# Patient Record
Sex: Female | Born: 1995 | Race: White | Hispanic: No | Marital: Single | Smoking: Current every day smoker
Health system: Southern US, Community
[De-identification: ages and names within clinical notes are randomized; demographics above are authoritative.]

## PROBLEM LIST (undated history)

## (undated) DIAGNOSIS — R011 Cardiac murmur, unspecified: Secondary | ICD-10-CM

## (undated) DIAGNOSIS — D649 Anemia, unspecified: Secondary | ICD-10-CM

## (undated) HISTORY — DX: Cardiac murmur, unspecified: R01.1

## (undated) HISTORY — DX: Anemia, unspecified: D64.9

---
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
## 2014-03-17 ENCOUNTER — Telehealth: Payer: Self-pay | Admitting: Hematology and Oncology

## 2014-03-17 NOTE — Telephone Encounter (Signed)
S/W PATIENT AND GAVE NP APPT FOR 06/01 @ 10:45 W/DR. GORSUCH REFERRING DR. [place] PACKET MAILED.

## 2014-03-23 ENCOUNTER — Telehealth: Payer: Self-pay | Admitting: Hematology and Oncology

## 2014-03-23 ENCOUNTER — Ambulatory Visit (HOSPITAL_BASED_OUTPATIENT_CLINIC_OR_DEPARTMENT_OTHER): Payer: 59

## 2014-03-23 ENCOUNTER — Ambulatory Visit (HOSPITAL_BASED_OUTPATIENT_CLINIC_OR_DEPARTMENT_OTHER): Payer: 59 | Admitting: Hematology and Oncology

## 2014-03-23 ENCOUNTER — Encounter: Payer: Self-pay | Admitting: Hematology and Oncology

## 2014-03-23 VITALS — BP 122/62 | HR 72 | Temp 97.9°F | Resp 18 | Ht 63.75 in | Wt 119.3 lb

## 2014-03-23 DIAGNOSIS — D509 Iron deficiency anemia, unspecified: Secondary | ICD-10-CM

## 2014-03-23 DIAGNOSIS — D473 Essential (hemorrhagic) thrombocythemia: Secondary | ICD-10-CM

## 2014-03-23 DIAGNOSIS — Z720 Tobacco use: Secondary | ICD-10-CM

## 2014-03-23 DIAGNOSIS — F17200 Nicotine dependence, unspecified, uncomplicated: Secondary | ICD-10-CM

## 2014-03-23 DIAGNOSIS — D500 Iron deficiency anemia secondary to blood loss (chronic): Secondary | ICD-10-CM | POA: Insufficient documentation

## 2014-03-23 DIAGNOSIS — D75839 Thrombocytosis, unspecified: Secondary | ICD-10-CM | POA: Insufficient documentation

## 2014-03-23 LAB — CBC & DIFF AND RETIC
BASO%: 2.1 % — ABNORMAL HIGH (ref 0.0–2.0)
BASOS ABS: 0.1 10*3/uL (ref 0.0–0.1)
EOS ABS: 0.2 10*3/uL (ref 0.0–0.5)
EOS%: 2.2 % (ref 0.0–7.0)
HCT: 34.6 % — ABNORMAL LOW (ref 34.8–46.6)
HEMOGLOBIN: 10.2 g/dL — AB (ref 11.6–15.9)
IMMATURE RETIC FRACT: 12.3 % — AB (ref 1.60–10.00)
LYMPH#: 3.7 10*3/uL — AB (ref 0.9–3.3)
LYMPH%: 54.1 % — ABNORMAL HIGH (ref 14.0–49.7)
MCH: 21.8 pg — ABNORMAL LOW (ref 25.1–34.0)
MCHC: 29.6 g/dL — ABNORMAL LOW (ref 31.5–36.0)
MCV: 73.6 fL — AB (ref 79.5–101.0)
MONO#: 0.6 10*3/uL (ref 0.1–0.9)
MONO%: 8.4 % (ref 0.0–14.0)
NEUT%: 33.2 % — ABNORMAL LOW (ref 38.4–76.8)
NEUTROS ABS: 2.3 10*3/uL (ref 1.5–6.5)
Platelets: 513 10*3/uL — ABNORMAL HIGH (ref 145–400)
RBC: 4.7 10*6/uL (ref 3.70–5.45)
RDW: 27.4 % — AB (ref 11.2–14.5)
RETIC %: 2.59 % — AB (ref 0.70–2.10)
RETIC CT ABS: 121.73 10*3/uL — AB (ref 33.70–90.70)
WBC: 6.8 10*3/uL (ref 3.9–10.3)

## 2014-03-23 LAB — FERRITIN CHCC: FERRITIN: 7 ng/mL — AB (ref 9–269)

## 2014-03-23 NOTE — Progress Notes (Signed)
Checked in new patient with no financial issues at this time and she has not seen the dr. She has not been out of country and has appt card.

## 2014-03-23 NOTE — Telephone Encounter (Signed)
gv adn printed appt sched and avs for pt for SEpt...sent pt to lab °

## 2014-03-23 NOTE — Progress Notes (Signed)
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 female is here because of severe anemia.  She was found to have abnormal CBC from recent blood work. According to scan report, her hemoglobin was 7.7 with associated microcytosis, iron deficiency and mild thrombocytosis. Please see scanned and report for further details. She denies recent chest pain on exertion or shortness of breath on minimal exertion. She had recurrent syncopal episodes as a teenager. She has chronic leg cramps as well as dizziness and severe fatigue. She had not noticed any recent bleeding such as epistaxis, hematuria or hematochezia The patient takes occasional over-the-counter Excedrin for headaches. She is not on antiplatelets agents.  She had no prior history or diagnosis of cancer. Her age appropriate screening programs are up-to-date. She significant pica with excessive chewing device and eats a variety of diet. She never donated blood or received blood transfusion The patient was prescribed oral iron supplements and she takes one tablet twice a day. He has significant history of menorrhagia over the past 6 years. She bleeds for 5 days every 28 days. She used birth control pill.  MEDICAL HISTORY:  Past Medical History  Diagnosis Date  . Anemia   . Heart murmur     SURGICAL HISTORY: History reviewed. No pertinent past surgical history.  SOCIAL HISTORY: History   Social History  . Marital Status: Single    Spouse Name: N/A    Number of Children: N/A  . Years of Education: N/A   Occupational History  . Not on file.   Social History Main Topics  . Smoking status: Current Every Day Smoker -- 0.25 packs/day for 3 years  . Smokeless tobacco: Never Used  . Alcohol Use: 1.8  oz/week    3 Shots of liquor per week  . Drug Use: No  . Sexual Activity: Not on file   Other Topics Concern  . Not on file   Social History Narrative  . No narrative on file    FAMILY HISTORY: History reviewed. No pertinent family history.  ALLERGIES:  has no allergies on file.  MEDICATIONS:  Current Outpatient Prescriptions  Medication Sig Dispense Refill  . ferrous fumarate (HEMOCYTE - 106 MG FE) 325 (106 FE) MG TABS tablet Take 1 tablet by mouth.      . Norgestimate-Ethinyl Estradiol Triphasic (ORTHO TRI-CYCLEN LO) 0.18/0.215/0.25 MG-25 MCG tab Take 1 tablet by mouth daily.       No current facility-administered medications for this visit.    REVIEW OF SYSTEMS:   Constitutional: Denies fevers, chills or abnormal night sweats Eyes: Denies blurriness of vision, double vision or watery eyes Ears, nose, mouth, throat, and face: Denies mucositis or sore throat Gastrointestinal:  Denies nausea, heartburn or change in bowel habits Skin: Denies abnormal skin rashes Lymphatics: Denies new lymphadenopathy or easy bruising Neurological:Denies numbness, tingling or new weaknesses Behavioral/Psych: Mood is stable, no new changes  All other systems were reviewed with the patient and are negative.  PHYSICAL EXAMINATION: ECOG PERFORMANCE STATUS: 1 - Symptomatic but completely ambulatory  Filed Vitals:   03/23/14 1138  BP: 122/62  Pulse: 72  Temp: 97.9 F (36.6 C)  Resp: 18   Filed Weights   03/23/14 1138  Weight: 119 lb 4.8 oz (54.114 kg)    GENERAL:alert, no distress and comfortable SKIN: skin color  is pale, texture, turgor are normal, no rashes or significant lesions EYES: normal, conjunctiva are pale and non-injected, sclera clear OROPHARYNX:no exudate, no erythema and lips, buccal mucosa, and tongue normal  NECK: supple, thyroid normal size, non-tender, without nodularity LYMPH:  no palpable lymphadenopathy in the cervical, axillary or inguinal LUNGS: clear to  auscultation and percussion with normal breathing effort HEART: regular rate & rhythm soft systolic murmur and no lower extremity edema ABDOMEN:abdomen soft, non-tender and normal bowel sounds Musculoskeletal:no cyanosis of digits and no clubbing  PSYCH: alert & oriented x 3 with fluent speech NEURO: no focal motor/sensory deficits  LABORATORY DATA:  I have reviewed the data as listed Recent Results (from the past 2160 hour(s))  CBC & DIFF AND RETIC     Status: Abnormal   Collection Time    03/23/14 12:13 PM      Result Value Ref Range   WBC 6.8  3.9 - 10.3 10e3/uL   NEUT# 2.3  1.5 - 6.5 10e3/uL   HGB 10.2 (*) 11.6 - 15.9 g/dL   HCT 34.6 (*) 34.8 - 46.6 %   Platelets 513 (*) 145 - 400 10e3/uL   MCV 73.6 (*) 79.5 - 101.0 fL   MCH 21.8 (*) 25.1 - 34.0 pg   MCHC 29.6 (*) 31.5 - 36.0 g/dL   RBC 4.70  3.70 - 5.45 10e6/uL   RDW 27.4 (*) 11.2 - 14.5 %   lymph# 3.7 (*) 0.9 - 3.3 10e3/uL   MONO# 0.6  0.1 - 0.9 10e3/uL   Eosinophils Absolute 0.2  0.0 - 0.5 10e3/uL   Basophils Absolute 0.1  0.0 - 0.1 10e3/uL   NEUT% 33.2 (*) 38.4 - 76.8 %   LYMPH% 54.1 (*) 14.0 - 49.7 %   MONO% 8.4  0.0 - 14.0 %   EOS% 2.2  0.0 - 7.0 %   BASO% 2.1 (*) 0.0 - 2.0 %   Retic % 2.59 (*) 0.70 - 2.10 %   Retic Ct Abs 121.73 (*) 33.70 - 90.70 10e3/uL   Immature Retic Fract 12.30 (*) 1.60 - 10.00 %  FERRITIN [facility]     Status: Abnormal   Collection Time    03/23/14 12:13 PM      Result Value Ref Range   Ferritin 7 (*) 9 - 269 ng/ml   ASSESSMENT & PLAN:  #1 Anemia, iron deficiency, likely due to menorrhagia #2 reactive thrombocytosis She tolerate iron supplements well. Repeat stat CBC in my office confirmed she is responding to oral iron supplement. I recommend addition of prenatal vitamin. Since she is doing well with oral iron supplement, I do not plan to give her intravenous iron replacement. I recommend she returns in 3 months with repeat labs. If she still has persistent iron deficiency anemia, I  would recommend a trial of intravenous iron in the future. I recommended she contacts her primary care provider regarding the use of birth control pills back to back to hold menstrual cycle for 3-4 months to allow time for her to replenish her iron stores. #3 tobacco abuse I spent some time counseling the patient the importance of tobacco cessation. she is currently attempting to quit on her own     All questions were answered. The patient knows to call the clinic with any problems, questions or concerns. I spent 40 minutes counseling the patient face to face. The total time spent in the appointment was 55 minutes and more than 50% was on counseling.     Heath Lark, MD  03/23/2014 11:02 PM

## 2014-06-22 ENCOUNTER — Telehealth: Payer: Self-pay | Admitting: Hematology and Oncology

## 2014-06-22 NOTE — Telephone Encounter (Signed)
Pt's mom cld to r/s pt's sch due to school, mailing sch to pt...Marland KitchenMarland KitchenKJ

## 2014-06-30 ENCOUNTER — Other Ambulatory Visit: Payer: 59

## 2014-06-30 ENCOUNTER — Ambulatory Visit: Payer: 59 | Admitting: Hematology and Oncology

## 2014-07-06 ENCOUNTER — Encounter: Payer: Self-pay | Admitting: Hematology and Oncology

## 2014-07-06 ENCOUNTER — Telehealth: Payer: Self-pay | Admitting: *Deleted

## 2014-07-06 ENCOUNTER — Other Ambulatory Visit (HOSPITAL_BASED_OUTPATIENT_CLINIC_OR_DEPARTMENT_OTHER): Payer: 59

## 2014-07-06 ENCOUNTER — Ambulatory Visit (HOSPITAL_BASED_OUTPATIENT_CLINIC_OR_DEPARTMENT_OTHER): Payer: 59 | Admitting: Hematology and Oncology

## 2014-07-06 VITALS — BP 129/62 | HR 65 | Temp 97.6°F | Resp 20 | Ht 63.75 in | Wt 115.5 lb

## 2014-07-06 DIAGNOSIS — D473 Essential (hemorrhagic) thrombocythemia: Secondary | ICD-10-CM

## 2014-07-06 DIAGNOSIS — D75839 Thrombocytosis, unspecified: Secondary | ICD-10-CM

## 2014-07-06 DIAGNOSIS — D500 Iron deficiency anemia secondary to blood loss (chronic): Secondary | ICD-10-CM

## 2014-07-06 DIAGNOSIS — D509 Iron deficiency anemia, unspecified: Secondary | ICD-10-CM

## 2014-07-06 LAB — CBC & DIFF AND RETIC
BASO%: 0.3 % (ref 0.0–2.0)
Basophils Absolute: 0 10*3/uL (ref 0.0–0.1)
EOS ABS: 0.1 10*3/uL (ref 0.0–0.5)
EOS%: 0.7 % (ref 0.0–7.0)
HCT: 41.5 % (ref 34.8–46.6)
HGB: 13.8 g/dL (ref 11.6–15.9)
Immature Retic Fract: 4.7 % (ref 1.60–10.00)
LYMPH%: 33.9 % (ref 14.0–49.7)
MCH: 28.8 pg (ref 25.1–34.0)
MCHC: 33.3 g/dL (ref 31.5–36.0)
MCV: 86.6 fL (ref 79.5–101.0)
MONO#: 0.7 10*3/uL (ref 0.1–0.9)
MONO%: 9.1 % (ref 0.0–14.0)
NEUT%: 56 % (ref 38.4–76.8)
NEUTROS ABS: 4.3 10*3/uL (ref 1.5–6.5)
PLATELETS: 332 10*3/uL (ref 145–400)
RBC: 4.79 10*6/uL (ref 3.70–5.45)
RDW: 13.8 % (ref 11.2–14.5)
RETIC %: 0.95 % (ref 0.70–2.10)
RETIC CT ABS: 45.51 10*3/uL (ref 33.70–90.70)
WBC: 7.6 10*3/uL (ref 3.9–10.3)
lymph#: 2.6 10*3/uL (ref 0.9–3.3)

## 2014-07-06 LAB — FERRITIN CHCC: FERRITIN: 31 ng/mL (ref 9–269)

## 2014-07-06 NOTE — Assessment & Plan Note (Signed)
This has resolved. She can discontinue iron supplement.

## 2014-07-06 NOTE — Assessment & Plan Note (Signed)
This was related to iron deficiency, resolved.

## 2014-07-06 NOTE — Telephone Encounter (Signed)
Instructed pt she can stop her Iron pill per Dr. Alvy Bimler as her Ferritin level has improved.  She asked if she can stop her Pre [place] vitamin as well?

## 2014-07-06 NOTE — Progress Notes (Signed)
[facility] OFFICE PROGRESS NOTE  Tiffany Stalker, PA-C SUMMARY OF HEMATOLOGIC HISTORY: She was found to have abnormal CBC from recent blood work. According to scan report, her hemoglobin was 7.7 with associated microcytosis, iron deficiency and mild thrombocytosis. Please see scanned and report for further details. She denies recent chest pain on exertion or shortness of breath on minimal exertion. She had recurrent syncopal episodes as a teenager. She has chronic leg cramps as well as dizziness and severe fatigue. She had not noticed any recent bleeding such as epistaxis, hematuria or hematochezia The patient takes occasional over-the-counter Excedrin for headaches. She is not on antiplatelets agents. She was recommended to take oral iron supplement along with a prenatal vitamin. INTERVAL HISTORY: Tiffany Roberson 18 y.o. female returns for further followup. She denies recent menorrhagia.  I have reviewed the past medical history, past surgical history, social history and family history with the patient and they are unchanged from previous note.  ALLERGIES:  has No Known Allergies.  MEDICATIONS:  Current Outpatient Prescriptions  Medication Sig Dispense Refill  . ferrous fumarate (HEMOCYTE - 106 MG FE) 325 (106 FE) MG TABS tablet Take 1 tablet by mouth.      . Norgestimate-Ethinyl Estradiol Triphasic (ORTHO TRI-CYCLEN LO) 0.18/0.215/0.25 MG-25 MCG tab Take 1 tablet by mouth daily.       No current facility-administered medications for this visit.     REVIEW OF SYSTEMS:   Constitutional: Denies fevers, chills or night sweats Eyes: Denies blurriness of vision Ears, nose, mouth, throat, and face: Denies mucositis or sore throat Respiratory: Denies cough, dyspnea or wheezes Cardiovascular: Denies palpitation, chest discomfort or lower extremity swelling Gastrointestinal:  Denies nausea, heartburn or change in bowel habits Skin: Denies abnormal skin rashes Lymphatics:  Denies new lymphadenopathy or easy bruising Neurological:Denies numbness, tingling or new weaknesses Behavioral/Psych: Mood is stable, no new changes  All other systems were reviewed with the patient and are negative.  PHYSICAL EXAMINATION: ECOG PERFORMANCE STATUS: 0 - Asymptomatic  Filed Vitals:   07/06/14 1009  BP: 129/62  Pulse: 65  Temp: 97.6 F (36.4 C)  Resp: 20   Filed Weights   07/06/14 1009  Weight: 115 lb 8 oz (52.39 kg)    GENERAL:alert, no distress and comfortable SKIN: skin color, texture, turgor are normal, no rashes or significant lesions EYES: normal, Conjunctiva are pink and non-injected, sclera clear Musculoskeletal:no cyanosis of digits and no clubbing  NEURO: alert & oriented x 3 with fluent speech, no focal motor/sensory deficits  LABORATORY DATA:  I have reviewed the data as listed Results for orders placed in visit on 07/06/14 (from the past 48 hour(s))  CBC & DIFF AND RETIC     Status: None   Collection Time    07/06/14  9:59 AM      Result Value Ref Range   WBC 7.6  3.9 - 10.3 10e3/uL   NEUT# 4.3  1.5 - 6.5 10e3/uL   HGB 13.8  11.6 - 15.9 g/dL   HCT 41.5  34.8 - 46.6 %   Platelets 332  145 - 400 10e3/uL   MCV 86.6  79.5 - 101.0 fL   MCH 28.8  25.1 - 34.0 pg   MCHC 33.3  31.5 - 36.0 g/dL   RBC 4.79  3.70 - 5.45 10e6/uL   RDW 13.8  11.2 - 14.5 %   lymph# 2.6  0.9 - 3.3 10e3/uL   MONO# 0.7  0.1 - 0.9 10e3/uL   Eosinophils Absolute 0.1  0.0 - 0.5 10e3/uL   Basophils Absolute 0.0  0.0 - 0.1 10e3/uL   NEUT% 56.0  38.4 - 76.8 %   LYMPH% 33.9  14.0 - 49.7 %   MONO% 9.1  0.0 - 14.0 %   EOS% 0.7  0.0 - 7.0 %   BASO% 0.3  0.0 - 2.0 %   Retic % 0.95  0.70 - 2.10 %   Retic Ct Abs 45.51  33.70 - 90.70 10e3/uL   Immature Retic Fract 4.70  1.60 - 10.00 %  [facility]IN [facility]     Status: None   Collection Time    07/06/14  9:59 AM      Result Value Ref Range   Ferritin 31  9 - 269 ng/ml    Lab Results  Component Value Date   WBC 7.6 07/06/2014   HGB  13.8 07/06/2014   HCT 41.5 07/06/2014   MCV 86.6 07/06/2014   PLT 332 07/06/2014    ASSESSMENT & PLAN:  Iron deficiency anemia secondary to blood loss (chronic) This has resolved. She can discontinue iron supplement.  Thrombocytosis This was related to iron deficiency, resolved.    All questions were answered. The patient knows to call the clinic with any problems, questions or concerns. No barriers to learning was detected.  I spent 10 minutes counseling the patient face to face. The total time spent in the appointment was 15 minutes and more than 50% was on counseling.     [place], [place], MD 07/06/2014 11:27 AM

## 2014-07-06 NOTE — Telephone Encounter (Signed)
Instructed pt to finish Pre [place] vitamins she has left and then stop.  She verbalized understanding.

## 2014-07-06 NOTE — Telephone Encounter (Signed)
Message copied by Cathlean Cower on Mon Jul 06, 2014  2:24 PM ------      Message from: [facility], [place]: Mon Jul 06, 2014 11:25 AM      Regarding: ferritin level       She can stop iron supplement      ----- Message -----         From: [facility] Interface         Sent: 07/06/2014  10:08 AM           To: Heath Lark, MD                   ------

## 2014-07-06 NOTE — Telephone Encounter (Signed)
Finish whatever she has left

## 2018-04-03 ENCOUNTER — Ambulatory Visit (INDEPENDENT_AMBULATORY_CARE_PROVIDER_SITE_OTHER): Payer: 59 | Admitting: Sports Medicine

## 2018-04-03 ENCOUNTER — Encounter: Payer: Self-pay | Admitting: Sports Medicine

## 2018-04-03 VITALS — BP 128/83 | HR 89 | Temp 97.1°F | Resp 16 | Ht 65.0 in | Wt 130.0 lb

## 2018-04-03 DIAGNOSIS — M79674 Pain in right toe(s): Secondary | ICD-10-CM | POA: Diagnosis not present

## 2018-04-03 DIAGNOSIS — L600 Ingrowing nail: Secondary | ICD-10-CM | POA: Diagnosis not present

## 2018-04-03 DIAGNOSIS — M79675 Pain in left toe(s): Secondary | ICD-10-CM

## 2018-04-03 NOTE — Progress Notes (Signed)
The subjective: Tiffany Roberson is a 22 y.o. female patient presents to office today complaining of a moderately painful incurvated, red, hot, swollen left greater than right lateral greater than medial nail border of the first toes bilateral. This has been present for 1 week with soreness throbbing pain 3 out of 10 and yellow drainage. Patient has treated this by soaking with Epson salt. Patient denies fever/chills/nausea/vomitting/any other related constitutional symptoms at this time.  Review of Systems  All other systems reviewed and are negative.   Patient Active Problem List   Diagnosis Date Noted  . Iron deficiency anemia secondary to blood loss (chronic) 03/23/2014  . Thrombocytosis ([place]) 03/23/2014  . Tobacco abuse 03/23/2014    Current Outpatient Medications on File Prior to Visit  Medication Sig Dispense Refill  . diazepam (VALIUM) 5 MG tablet Take 5 mg by mouth every 6 (six) hours as needed for anxiety.     No current facility-administered medications on file prior to visit.     No Known Allergies  Objective:  Vitals:   04/03/18 1121  Weight: 130 lb (59 kg)  Height: 5\' 5"  (1.651 m)    General: Well developed, nourished, in no acute distress, alert and oriented x3   Dermatology: Skin is warm, dry and supple bilateral.  Left and right hallux nail appears to be  severely incurvated with hyperkeratosis formation at the distal aspects of  the medial and lateral nail borders. (+) Erythema. (+) Edema. (-) serosanguous  drainage present. The remaining nails appear unremarkable at this time. There are no open sores, lesions or other signs of infection  present.  Vascular: Dorsalis Pedis artery and Posterior Tibial artery pedal pulses are 2/4 bilateral with immedate capillary fill time. Pedal hair growth present. No lower extremity edema.   Neruologic: Grossly intact via light touch bilateral.  Musculoskeletal: Tenderness to palpation of the bilateral hallux nail folds.   Muscular strength within normal limits in all groups bilateral.   Assesement and Plan: Problem List Items Addressed This Visit    None    Visit Diagnoses    Ingrown toenail    -  Primary   Toe pain, bilateral          -Discussed treatment alternatives and plan of care; Explained permanent/temporary nail avulsion and post procedure course to patient.  Patient opts for permanent nail avulsion procedures at both big toes. - After a verbal and written consent, injected 3 ml of a 50:50 mixture of 2% plain  lidocaine and 0.5% plain marcaine in a normal hallux block fashion. Next, a  betadine prep was performed. Anesthesia was tested and found to be appropriate.  The offending right and left medial and lateral nail border was then incised from the hyponychium to the epinychium. The offending nail border was removed and cleared from the field. The area was curretted for any remaining nail or spicules. Phenol application performed and the area was then flushed with alcohol and dressed with antibiotic cream and a dry sterile dressing. -Patient was instructed to leave the dressing intact for today and begin soaking  in a weak solution of betadine or Epsom salt and water tomorrow. Patient was instructed to  soak for 15 minutes each day and apply neosporin and a gauze or bandaid dressing each day. -Patient was instructed to monitor the toe for signs of infection and return to office if toe becomes red, hot or swollen. -Advised ice, elevation, and tylenol or motrin if needed for pain.  -Patient is to  return in 2 weeks for follow up care/nail check or sooner if problems arise.  Landis Martins, DPM

## 2018-04-03 NOTE — Progress Notes (Signed)
   Subjective:    Patient ID: Tiffany Roberson, female    DOB: Dec 29, 1995, 22 y.o.   MRN: [id number]  HPI    Review of Systems  All other systems reviewed and are negative.      Objective:   Physical Exam        Assessment & Plan:

## 2018-04-03 NOTE — Patient Instructions (Signed)

## 2018-04-12 ENCOUNTER — Telehealth: Payer: Self-pay | Admitting: Sports Medicine

## 2018-04-12 NOTE — Telephone Encounter (Signed)
Tried to return patients phone call when should have gone to voicemail went to a message management system requesting pass code will try again later

## 2018-04-12 NOTE — Telephone Encounter (Signed)
I had just gotten my ingrown removed last Wednesday 12 June. I wanted to know if I could get into the pool? If you could give me a call back my number is [phone]. Thank you.

## 2018-04-17 ENCOUNTER — Ambulatory Visit (INDEPENDENT_AMBULATORY_CARE_PROVIDER_SITE_OTHER): Payer: 59 | Admitting: Sports Medicine

## 2018-04-17 ENCOUNTER — Encounter: Payer: Self-pay | Admitting: Sports Medicine

## 2018-04-17 DIAGNOSIS — Z9889 Other specified postprocedural states: Secondary | ICD-10-CM

## 2018-04-17 DIAGNOSIS — M79674 Pain in right toe(s): Secondary | ICD-10-CM

## 2018-04-17 DIAGNOSIS — M79675 Pain in left toe(s): Secondary | ICD-10-CM

## 2018-04-17 NOTE — Patient Instructions (Signed)

## 2018-04-17 NOTE — Progress Notes (Signed)
Subjective: Tiffany Roberson is a 22 y.o. female patient returns to office today for follow up evaluation after having Right and Left Hallux medial and lateral permanent nail avulsions performed on 04-03-18. Patient has been soaking using betadine/epsom salt and applying topical antibiotic covered with bandaid daily. Patient deniesfever/chills/nausea/vomitting/any other related constitutional symptoms at this time.  Patient Active Problem List   Diagnosis Date Noted  . Iron deficiency anemia secondary to blood loss (chronic) 03/23/2014  . Thrombocytosis ([place]) 03/23/2014  . Tobacco abuse 03/23/2014    Current Outpatient Medications on File Prior to Visit  Medication Sig Dispense Refill  . diazepam (VALIUM) 5 MG tablet Take 5 mg by mouth every 6 (six) hours as needed for anxiety.     No current facility-administered medications on file prior to visit.     No Known Allergies  Objective:  General: Well developed, nourished, in no acute distress, alert and oriented x3   Dermatology: Skin is warm, dry and supple bilateral.Right and left hallux medial and lateral nail bed appears to be clean, dry, with mild granular tissue and surrounding eschar/scab. (+) Faint localized Erythema. (-) Edema. (-) serosanguous drainage present. The remaining nails appear unremarkable at this time. There are no other lesions or other signs of infection  present.  Neurovascular status: Intact. No lower extremity swelling; No pain with calf compression bilateral.  Musculoskeletal: Decreased tenderness to palpation of the right and left hallux nail fold(s). Muscular strength within normal limits bilateral.   Assesement and Plan: Problem List Items Addressed This Visit    None    Visit Diagnoses    S/P nail surgery    -  Primary   Toe pain, bilateral         -Examined patient  -Cleansed right and left hallux medial/lateral nail fold and gently scrubbed with peroxide and q-tip/curetted away eschar at site and  applied antibiotic cream covered with bandaid.  -Discussed plan of care with patient. -Patient to now begin soaking in a weak solution of Epsom salt and warm water once daily. Patient was instructed to soak for 15-20 minutes each day until the toe appears normal and there is no drainage, redness, tenderness, or swelling at the procedure site, and apply neosporin and a gauze or bandaid dressing each day as needed. May leave open to air at night. -Educated patient on long term care after nail surgery. -Patient was instructed to monitor the toes for reoccurrence and signs of infection; Patient advised to return to office or go to ER if toe becomes red, hot or swollen. -Patient is to return as needed or sooner if problems arise.  Landis Martins, DPM
